# Patient Record
Sex: Female | Born: 1935 | Race: White | Hispanic: No | Smoking: Never smoker
Health system: Southern US, Community
[De-identification: ages and names within clinical notes are randomized; demographics above are authoritative.]

## PROBLEM LIST (undated history)

## (undated) DIAGNOSIS — I10 Essential (primary) hypertension: Secondary | ICD-10-CM

## (undated) HISTORY — PX: SUBMAXILLARY CYST  EXCISION: SHX2458

## (undated) HISTORY — PX: INTESTINAL MALROTATION REPAIR: SHX411

## (undated) HISTORY — PX: THYROIDECTOMY: SHX17

## (undated) HISTORY — PX: ABDOMINAL HYSTERECTOMY: SHX81

## (undated) HISTORY — PX: CARDIAC SURGERY: SHX584

## (undated) HISTORY — PX: DG KNEE LEFT COMPLETE (ARMC HX): HXRAD1556

## (undated) HISTORY — PX: FL INJ RIGHT KNEE MR ARTHROGRAM (ARMC HX): HXRAD1302

## (undated) HISTORY — PX: CHOLECYSTECTOMY: SHX55

---

## 2015-12-16 ENCOUNTER — Inpatient Hospital Stay
Admission: EM | Admit: 2015-12-16 | Discharge: 2015-12-17 | DRG: 379 | Disposition: A | Discharge status: Home / Self Care | Payer: Medicare Other | Attending: Internal Medicine | Admitting: Internal Medicine

## 2015-12-16 ENCOUNTER — Encounter: Payer: Self-pay | Admitting: Emergency Medicine

## 2015-12-16 ENCOUNTER — Inpatient Hospital Stay: Payer: Medicare Other

## 2015-12-16 DIAGNOSIS — K922 Gastrointestinal hemorrhage, unspecified: Secondary | ICD-10-CM | POA: Diagnosis present

## 2015-12-16 DIAGNOSIS — K625 Hemorrhage of anus and rectum: Secondary | ICD-10-CM | POA: Diagnosis present

## 2015-12-16 DIAGNOSIS — E860 Dehydration: Secondary | ICD-10-CM | POA: Diagnosis present

## 2015-12-16 DIAGNOSIS — Z833 Family history of diabetes mellitus: Secondary | ICD-10-CM | POA: Diagnosis not present

## 2015-12-16 DIAGNOSIS — I959 Hypotension, unspecified: Secondary | ICD-10-CM | POA: Diagnosis present

## 2015-12-16 DIAGNOSIS — D696 Thrombocytopenia, unspecified: Secondary | ICD-10-CM | POA: Diagnosis present

## 2015-12-16 DIAGNOSIS — K5791 Diverticulosis of intestine, part unspecified, without perforation or abscess with bleeding: Principal | ICD-10-CM | POA: Diagnosis present

## 2015-12-16 DIAGNOSIS — I10 Essential (primary) hypertension: Secondary | ICD-10-CM | POA: Diagnosis present

## 2015-12-16 DIAGNOSIS — D72829 Elevated white blood cell count, unspecified: Secondary | ICD-10-CM | POA: Diagnosis present

## 2015-12-16 HISTORY — DX: Essential (primary) hypertension: I10

## 2015-12-16 LAB — COMPREHENSIVE METABOLIC PANEL
ALBUMIN: 4 g/dL (ref 3.5–5.0)
ALK PHOS: 68 U/L (ref 38–126)
ALT: 16 U/L (ref 14–54)
AST: 23 U/L (ref 15–41)
Anion gap: 9 (ref 5–15)
BILIRUBIN TOTAL: 1.4 mg/dL — AB (ref 0.3–1.2)
BUN: 38 mg/dL — AB (ref 6–20)
CALCIUM: 8.6 mg/dL — AB (ref 8.9–10.3)
CO2: 25 mmol/L (ref 22–32)
CREATININE: 1.11 mg/dL — AB (ref 0.44–1.00)
Chloride: 106 mmol/L (ref 101–111)
GFR calc Af Amer: 48 mL/min — ABNORMAL LOW (ref >60)
GFR calc non Af Amer: 41 mL/min — ABNORMAL LOW (ref >60)
GLUCOSE: 155 mg/dL — AB (ref 65–99)
Potassium: 4.4 mmol/L (ref 3.5–5.1)
SODIUM: 140 mmol/L (ref 135–145)
TOTAL PROTEIN: 6.6 g/dL (ref 6.5–8.1)

## 2015-12-16 LAB — URINALYSIS COMPLETE WITH MICROSCOPIC (ARMC ONLY)
BACTERIA UA: NONE SEEN
Bilirubin Urine: NEGATIVE
Glucose, UA: NEGATIVE mg/dL
Nitrite: NEGATIVE
PH: 5 (ref 5.0–8.0)
Protein, ur: NEGATIVE mg/dL
Specific Gravity, Urine: 1.01 (ref 1.005–1.030)

## 2015-12-16 LAB — TYPE AND SCREEN
ABO/RH(D): O POS
ANTIBODY SCREEN: NEGATIVE

## 2015-12-16 LAB — CBC WITH DIFFERENTIAL/PLATELET
Basophils Absolute: 0 10*3/uL (ref 0–0.1)
EOS ABS: 0.1 10*3/uL (ref 0–0.7)
Eosinophils Relative: 0 %
HEMATOCRIT: 46 % (ref 35.0–47.0)
HEMOGLOBIN: 15.2 g/dL (ref 12.0–16.0)
LYMPHS ABS: 0.8 10*3/uL — AB (ref 1.0–3.6)
Lymphocytes Relative: 6 %
MCH: 31.2 pg (ref 26.0–34.0)
MCHC: 33.1 g/dL (ref 32.0–36.0)
MCV: 94.4 fL (ref 80.0–100.0)
Monocytes Absolute: 0.7 10*3/uL (ref 0.2–0.9)
NEUTROS ABS: 10.9 10*3/uL — AB (ref 1.4–6.5)
Platelets: 128 10*3/uL — ABNORMAL LOW (ref 150–440)
RBC: 4.87 MIL/uL (ref 3.80–5.20)
RDW: 14.7 % — ABNORMAL HIGH (ref 11.5–14.5)
WBC: 12.4 10*3/uL — AB (ref 3.6–11.0)

## 2015-12-16 LAB — PROTIME-INR
INR: 1.09
Prothrombin Time: 14.3 seconds (ref 11.4–15.0)

## 2015-12-16 LAB — HEMOGLOBIN: Hemoglobin: 13 g/dL (ref 12.0–16.0)

## 2015-12-16 LAB — LIPASE, BLOOD: Lipase: 30 U/L (ref 11–51)

## 2015-12-16 LAB — APTT: aPTT: <24 seconds — ABNORMAL LOW (ref 24–36)

## 2015-12-16 MED ORDER — SODIUM CHLORIDE 0.9 % IV SOLN
INTRAVENOUS | Status: DC
  Administered 2015-12-16 – 2015-12-17 (×2): via INTRAVENOUS

## 2015-12-16 MED ORDER — ACETAMINOPHEN 650 MG RE SUPP: 650.0000 mg | Freq: Four times a day (QID) | RECTAL | Status: DC | PRN

## 2015-12-16 MED ORDER — ACETAMINOPHEN 325 MG PO TABS: 650.0000 mg | ORAL_TABLET | Freq: Four times a day (QID) | ORAL | Status: DC | PRN

## 2015-12-16 MED ORDER — ONDANSETRON HCL 4 MG/2ML IJ SOLN: 4.0000 mg | Freq: Four times a day (QID) | INTRAMUSCULAR | Status: DC | PRN

## 2015-12-16 MED ORDER — SODIUM CHLORIDE 0.9 % IV BOLUS (SEPSIS): 500.0000 mL | Freq: Once | INTRAVENOUS | Status: DC

## 2015-12-16 MED ORDER — FAMOTIDINE IN NACL 20-0.9 MG/50ML-% IV SOLN
20.0000 mg | Freq: Two times a day (BID) | INTRAVENOUS | Status: DC
  Administered 2015-12-16 – 2015-12-17 (×3): 20 mg via INTRAVENOUS
  Filled 2015-12-16 (×5): qty 50

## 2015-12-16 MED ORDER — ONDANSETRON HCL 4 MG PO TABS: 4.0000 mg | ORAL_TABLET | Freq: Four times a day (QID) | ORAL | Status: DC | PRN

## 2015-12-16 MED ORDER — TECHNETIUM TC 99M-LABELED RED BLOOD CELLS IV KIT
20.0000 | PACK | Freq: Once | INTRAVENOUS | Status: AC | PRN
  Administered 2015-12-16: 21.805 via INTRAVENOUS

## 2015-12-16 MED ORDER — SODIUM CHLORIDE 0.9 % IV BOLUS (SEPSIS)
1000.0000 mL | Freq: Once | INTRAVENOUS | Status: AC
  Administered 2015-12-16: 1000 mL via INTRAVENOUS

## 2015-12-16 MED ORDER — ALBUTEROL SULFATE (2.5 MG/3ML) 0.083% IN NEBU: 2.5000 mg | INHALATION_SOLUTION | RESPIRATORY_TRACT | Status: DC | PRN

## 2015-12-16 NOTE — H&P (Signed)
[facility] - [place] at [facility]   PATIENT NAME: [place] Cleveland[place]othy [place]    MR#:  [id number]  DATE OF BIRTH:  05-Jul-1920  DATE OF ADMISSION:  12/16/2015  PRIMARY CARE PHYSICIAN: Pcp Not In System   REQUESTING/REFERRING PHYSICIAN: Gayla DossEryka A Gayle, MD  CHIEF COMPLAINT:   Chief Complaint  Patient presents with  . Rectal Bleeding  Rectal bleeding this morning.  HISTORY OF PRESENT ILLNESS:  [place]  is a 82 y.o. female with a known history of Hypertension. The patient noticed rectal bleeding with clotted blood at 5:00 this morning. So the patient was sent to ED for further evaluation. When the ED physician, Dr. Inocencio HomesGayle examed the patient, she still has rectal bleeding. The patient denies any other symptoms. No abdominal pain, nausea, vomiting or diarrhea or constipation. She never had GI bleeding before. She said that she might have colonoscopy more than 10 years ago. Her blood pressure decreased to 86/55 while lying on the bed when I examined the patient.. She is being treated with IV fluid in the ED.  PAST MEDICAL HISTORY:   Past Medical History  Diagnosis Date  . Hypertension     PAST SURGICAL HISTORY:   Past Surgical History  Procedure Laterality Date  . Thyroidectomy    . Abdominal hysterectomy    . Cardiac surgery    . Intestinal malrotation repair    . Submaxillary cyst  excision    . Dg knee left complete ([facility] hx)    . Fl inj right knee mr arthrogram ([facility] hx)    . Cholecystectomy      SOCIAL HISTORY:   Social History  Substance Use Topics  . Smoking status: Never Smoker   . Smokeless tobacco: Never Used  . Alcohol Use: No    FAMILY HISTORY:   Family History  Problem Relation Age of Onset  . Diabetes Mother     DRUG ALLERGIES:  No Known Allergies  REVIEW OF SYSTEMS:  CONSTITUTIONAL: No fever, fatigue or weakness.  EYES: No blurred or double vision.  EARS, NOSE, AND THROAT: No tinnitus or ear pain.  RESPIRATORY: No cough,  shortness of breath, wheezing or hemoptysis.  CARDIOVASCULAR: No chest pain, orthopnea, edema.  GASTROINTESTINAL: No nausea, vomiting, diarrhea or abdominal pain. But has rectal bleeding. GENITOURINARY: No dysuria, hematuria.  ENDOCRINE: No polyuria, nocturia,  HEMATOLOGY: No anemia, easy bruising or bleeding SKIN: No rash or lesion. MUSCULOSKELETAL: No joint pain or arthritis.   NEUROLOGIC: No tingling, numbness, weakness.  PSYCHIATRY: No anxiety or depression.   MEDICATIONS AT HOME:   Prior to Admission medications   Medication Sig Start Date End Date Taking? Authorizing Provider  diltiazem (TIAZAC) 180 MG 24 hr capsule Take 180 mg by mouth daily.   Yes Historical Provider, MD      VITAL SIGNS:  Blood pressure 145/87, pulse 97, temperature 97.6 F (36.4 C), temperature source Oral, resp. rate 19, height 5\' 4"  (1.626 m), weight 125 lb (56.7 kg), SpO2 99 %.  PHYSICAL EXAMINATION:  GENERAL:  82 y.o.-year-old patient lying in the bed with no acute distress.  EYES: Pupils equal, round, reactive to light and accommodation. No scleral icterus. Extraocular muscles intact.  HEENT: Head atraumatic, normocephalic. Oropharynx and nasopharynx clear.  NECK:  Supple, no jugular venous distention. No thyroid enlargement, no tenderness.  LUNGS: Normal breath sounds bilaterally, no wheezing, rales,rhonchi or crepitation. No use of accessory muscles of respiration.  CARDIOVASCULAR: S1, S2 normal. No murmurs, rubs, or gallops.  ABDOMEN: Soft, nontender,  nondistended. Bowel sounds present. No organomegaly or mass.  EXTREMITIES: Trace leg edema, no cyanosis, or clubbing.  NEUROLOGIC: Cranial nerves II through XII are intact. Muscle strength 5/5 in all extremities. Sensation intact. Gait not checked.  PSYCHIATRIC: The patient is alert and oriented x 3.  SKIN: No obvious rash, lesion, or ulcer.   LABORATORY PANEL:   CBC  Recent Labs Lab 12/16/15 0934  WBC 12.4*  HGB 15.2  HCT 46.0  PLT 128*    ------------------------------------------------------------------------------------------------------------------  Chemistries   Recent Labs Lab 12/16/15 0934  NA 140  K 4.4  CL 106  CO2 25  GLUCOSE 155*  BUN 38*  CREATININE 1.11*  CALCIUM 8.6*  AST 23  ALT 16  ALKPHOS 68  BILITOT 1.4*   ------------------------------------------------------------------------------------------------------------------  Cardiac Enzymes No results for input(s): TROPONINI in the last 168 hours. ------------------------------------------------------------------------------------------------------------------  RADIOLOGY:  No results found.  EKG:   Orders placed or performed during the hospital encounter of 12/16/15  . ED EKG  . ED EKG    IMPRESSION AND PLAN:   Rectal bleeding. NPO, pepcid iv bid, NS iv, GI consult. I discussed with Dr. Markham Jordan.  Dehydration. NS iv, follow up BMP.  Hypotension. NS iv bolus.  Leukocytosis. Unclear itiology, follow-up CBC.  Thrombocytopenia. Follow-up CBC.  HTN. Hold cardizem due to hypotension.   All the records are reviewed and case discussed with ED provider. Management plans discussed with the patient, her son and they are in agreement.  CODE STATUS: full code.  TOTAL TIME TAKING CARE OF THIS PATIENT: 55 minutes.    Shaune Pollack M.D on 12/16/2015 at 10:51 AM  Between 7am to 6pm - Pager - [phone]  After 6pm go to [email or web address] - password [email or web address]  [facility]  Office  [phone]  CC: Primary care physician; Pcp Not In System

## 2015-12-16 NOTE — ED Notes (Signed)
Pt arrived by EMS after having 7 episodes of rectal bleeding this morning. Pt states she felt as if she was going to have diarrhea but that it was "dark blood". Pt has hx of HTN.

## 2015-12-16 NOTE — Progress Notes (Signed)
Per Dr. Mechele CollinElliott place order for pt to have water and apple juice tonight. Okay to start clear liquids in the morning.

## 2015-12-16 NOTE — Consult Note (Signed)
GI Inpatient Consult Note  Reason for Consult: Rectal Bleeding    Attending Requesting Consult: Dr. Imogene Burnhen   History of Present Illness: [place] is a 82 y.o. female seen for evaluation of rectal bleeding at the request of Dr. Imogene Burnhen. PMHx of HTN.   She presents for rectal bleeding, began around 5am this morning. She reports 5 episodes of bleeding at home, mixture of BRB with dark clots. Acute onset of bleeding. No abdominal pain. She denies a history of constipation, diarrhea, nausea, vomiting. She has never had GI bleeding in past. Known history of diverticulosis with resection 1995. Believes last colonoscopy was 2004 only remarkable for diverticulosis. She denies history of PUD. No NSAID use. Doesn't take baby aspirin. Reports only medication is for HTN.    Last Colonoscopy: per patient 2004-diverticulosis.  Last Endoscopy: none prior    Past Medical History:  Past Medical History  Diagnosis Date  . Hypertension     Problem List: Patient Active Problem List   Diagnosis Date Noted  . GIB (gastrointestinal bleeding) 12/16/2015    Past Surgical History: Past Surgical History  Procedure Laterality Date  . Thyroidectomy    . Abdominal hysterectomy    . Cardiac surgery    . Intestinal malrotation repair    . Submaxillary cyst  excision    . Dg knee left complete ([facility] hx)    . Fl inj right knee mr arthrogram ([facility] hx)    . Cholecystectomy    Per patient 11 inch colon removed during diverticulitis resection, malrotation surgery details unclear.   Allergies: No Known Allergies  Home Medications:  (Not in a hospital admission) Home medication reconciliation was completed with the patient.   Scheduled Inpatient Medications:    Continuous Inpatient Infusions:    PRN Inpatient Medications:    Family History: family history includes Diabetes in her mother. Denies fam hx of GI bleeding, colon cancer, colon polyps.   Social History:   reports that she has never  smoked. She has never used smokeless tobacco. She reports that she does not drink alcohol or use illicit drugs.   Review of Systems: Constitutional: Weight is stable.  Eyes: No changes in vision. ENT: No oral lesions, sore throat.  GI: see HPI.  Heme/Lymph: No easy bruising.  CV: No chest pain.  GU: No hematuria.  Integumentary: No rashes.  Neuro: No headaches.  Psych: No depression/anxiety.  Endocrine: No heat/cold intolerance.  Allergic/Immunologic: No urticaria.  Resp: No cough, SOB.  Musculoskeletal: No joint swelling.    Physical Examination: BP 150/90 mmHg  Pulse 76  Temp(Src) 97.6 F (36.4 C) (Oral)  Resp 18  Ht 5\' 4"  (1.626 m)  Wt 125 lb (56.7 kg)  BMI 21.45 kg/m2  SpO2 98% Gen: NAD, alert and oriented x 4 HEENT: PEERLA, EOMI, Neck: supple, no JVD or thyromegaly Chest: CTA bilaterally, no wheezes, crackles, or other adventitious sounds CV: RRR, no m/g/c/r Abd: soft, NT, ND, +BS in all four quadrants-hyperactive; no HSM, guarding, ridigity, or rebound tenderness Rectal-depend saturated w/ BRB-maroon colored stool, BRB w/ dark clots on DRE. Ext: no edema, well perfused with 2+ pulses, Skin: no rash or lesions noted Lymph: no LAD  Data: Lab Results  Component Value Date   WBC 12.4* 12/16/2015   HGB 15.2 12/16/2015   HCT 46.0 12/16/2015   MCV 94.4 12/16/2015   PLT 128* 12/16/2015    Recent Labs Lab 12/16/15 0934  HGB 15.2   Lab Results  Component Value Date   NA 140  12/16/2015   K 4.4 12/16/2015   CL 106 12/16/2015   CO2 25 12/16/2015   BUN 38* 12/16/2015   CREATININE 1.11* 12/16/2015   Lab Results  Component Value Date   ALT 16 12/16/2015   AST 23 12/16/2015   ALKPHOS 68 12/16/2015   BILITOT 1.4* 12/16/2015    Recent Labs Lab 12/16/15 0934  APTT <24*  INR 1.09   Assessment/Plan: Ms. Krinsky is a 82 y.o. female admitted for GI bleeding.   1. Rectal bleeding - acute onset x 6 hours, 5 episodes of rectal bleeding. Most likely  diverticular. Less likely UGI source based on appearance. She does have known history of diverticulosis and reports recurrent diverticulitis w/ resection 20 years ago. Differential also includes AVM. Will proceed w/ stat bleeding scan, nuclear tech aware, discussed via phone.   Recommendations:  1. Monitor H/H 2. Stat Bleeding Scan    -Case discussed w/ Dr. Mechele Collin.   Thank you for the consult. Please call with questions or concerns.  Bluford Kaufmann, PA-C [facility] GI

## 2015-12-16 NOTE — ED Provider Notes (Signed)
[facility] Emergency Department Provider Note   ____________________________________________  Time seen: Approximately 9:07 AM  I have reviewed the triage vital signs and the nursing notes.   HISTORY  Chief Complaint Rectal Bleeding    HPI Arne ClevelandDorothy Banke is a 82 y.o. female with history of hypertension, no other chronic medical problems who presents for evaluation of several episodes of bright red blood per rectum since this morning, gradual onset, constant, no modifying factors, severe. No history of GI bleed. No abdominal pain. No vomiting or hematemesis. No chest pain or difficulty breathing.   Past Medical History  Diagnosis Date  . Hypertension     Patient Active Problem List   Diagnosis Date Noted  . GIB (gastrointestinal bleeding) 12/16/2015    Past Surgical History  Procedure Laterality Date  . Thyroidectomy    . Abdominal hysterectomy    . Cardiac surgery    . Intestinal malrotation repair    . Submaxillary cyst  excision    . Dg knee left complete ([facility] hx)    . Fl inj right knee mr arthrogram ([facility] hx)    . Cholecystectomy      No current outpatient prescriptions on file.  Allergies Review of patient's allergies indicates no known allergies.  Family History  Problem Relation Age of Onset  . Diabetes Mother     Social History Social History  Substance Use Topics  . Smoking status: Never Smoker   . Smokeless tobacco: Never Used  . Alcohol Use: No    Review of Systems Constitutional: No fever/chills Eyes: No visual changes. ENT: No sore throat. Cardiovascular: Denies chest pain. Respiratory: Denies shortness of breath. Gastrointestinal: No abdominal pain.  No nausea, no vomiting.  No diarrhea.  No constipation. Genitourinary: Negative for dysuria. Musculoskeletal: Negative for back pain. Skin: Negative for rash. Neurological: Negative for headaches, focal weakness or numbness.  10-point ROS otherwise  negative.  ____________________________________________   PHYSICAL EXAM:  Filed Vitals:   12/16/15 1204 12/16/15 1205 12/16/15 1323 12/16/15 1348  BP: 150/75 150/75 165/75   Pulse: 71  86   Temp:   98.2 F (36.8 C)   TempSrc:   Oral   Resp: 21  14   Height:    5\' 4"  (1.626 m)  Weight:    113 lb 3.2 oz (51.347 kg)  SpO2: 99%  99%        Constitutional: Alert and oriented. Well appearing and in no acute distress. Eyes: Conjunctivae are normal. PERRL. EOMI. Head: Atraumatic. Nose: No congestion/rhinnorhea. Mouth/Throat: Mucous membranes are moist.  Oropharynx non-erythematous. Neck: No stridor. Supple without meningismus. Cardiovascular: Normal rate, regular rhythm. Grossly normal heart sounds.  Good peripheral circulation. Respiratory: Normal respiratory effort.  No retractions. Lungs CTAB. Gastrointestinal: Soft and nontender. No distention. No CVA tenderness. Rectal: bright blood in the rectal vault is strongly guaiac positive. Musculoskeletal: No lower extremity tenderness nor edema.  No joint effusions. Neurologic:  Normal speech and language. No gross focal neurologic deficits are appreciated. No gait instability. Skin:  Skin is warm, dry and intact. No rash noted. Psychiatric: Mood and affect are normal. Speech and behavior are normal.  ____________________________________________   LABS (all labs ordered are listed, but only abnormal results are displayed)  Labs Reviewed  CBC WITH DIFFERENTIAL/PLATELET - Abnormal; Notable for the following:    WBC 12.4 (*)    RDW 14.7 (*)    Platelets 128 (*)    Neutro Abs 10.9 (*)    Lymphs Abs 0.8 (*)  All other components within normal limits  COMPREHENSIVE METABOLIC PANEL - Abnormal; Notable for the following:    Glucose, Bld 155 (*)    BUN 38 (*)    Creatinine, Ser 1.11 (*)    Calcium 8.6 (*)    Total Bilirubin 1.4 (*)    GFR calc non Af Amer 41 (*)    GFR calc Af Amer 48 (*)    All other components within normal  limits  APTT - Abnormal; Notable for the following:    aPTT <24 (*)    All other components within normal limits  LIPASE, BLOOD  PROTIME-INR  HEMOGLOBIN  HEMOGLOBIN  URINALYSIS COMPLETEWITH MICROSCOPIC ([facility] ONLY)  TYPE AND SCREEN   ____________________________________________  EKG  ED ECG REPORT I, Gayla Doss, the attending physician, personally viewed and interpreted this ECG.   Date: 12/16/2015  EKG Time: 10:18  Rate: 76  Rhythm: sinus rhythm with PACs  Axis: normal  Intervals:none  ST&T Change: No acute ST elevation.  ____________________________________________  RADIOLOGY  none ____________________________________________   PROCEDURES  Procedure(s) performed: None  Critical Care performed: No  ____________________________________________   INITIAL IMPRESSION / ASSESSMENT AND PLAN / ED COURSE  Pertinent labs & imaging results that were available during my care of the patient were reviewed by me and considered in my medical decision making (see chart for details).  Saree Krogh is a 82 y.o. female with history of hypertension, no other chronic medical problems who presents for evaluation of several episodes of bright red blood per rectum. On exam, she is nontoxic appearing in no acute distress. Her vital signs are stable, she is afebrile. She does have skin pallor but she is not tachycardic, tachypneic or hypotensive. She has bright red blood in the rectal vault concerning for ongoing lower GI bleed. She is not chronically anticoagulated. I reviewed her labs. CBC with mild leukocytosis, white blood cell count is 12.4, hemoglobin 15.2. Platelets are slightly low at 128. CMP with mild BUN and creatinine elevation, will give IV fluids. Case discussed with hospitalist, Dr. Imogene Burn for admission at 10:30 AM. ____________________________________________   FINAL CLINICAL IMPRESSION(S) / ED DIAGNOSES  Final diagnoses:  Lower GI bleed      NEW MEDICATIONS  STARTED DURING THIS VISIT:  Current Discharge Medication List       Note:  This document was prepared using Dragon voice recognition software and may include unintentional dictation errors.    Gayla Doss, MD 12/16/15 [phone]

## 2015-12-17 LAB — CBC
HCT: 36.3 % (ref 35.0–47.0)
Hemoglobin: 12.2 g/dL (ref 12.0–16.0)
MCH: 31.9 pg (ref 26.0–34.0)
MCHC: 33.7 g/dL (ref 32.0–36.0)
MCV: 94.7 fL (ref 80.0–100.0)
PLATELETS: 117 10*3/uL — AB (ref 150–440)
RBC: 3.83 MIL/uL (ref 3.80–5.20)
RDW: 14.5 % (ref 11.5–14.5)
WBC: 6.4 10*3/uL (ref 3.6–11.0)

## 2015-12-17 LAB — BASIC METABOLIC PANEL
Anion gap: 6 (ref 5–15)
BUN: 25 mg/dL — ABNORMAL HIGH (ref 6–20)
CALCIUM: 7.8 mg/dL — AB (ref 8.9–10.3)
CO2: 22 mmol/L (ref 22–32)
CREATININE: 0.96 mg/dL (ref 0.44–1.00)
Chloride: 115 mmol/L — ABNORMAL HIGH (ref 101–111)
GFR, EST AFRICAN AMERICAN: 57 mL/min — AB (ref >60)
GFR, EST NON AFRICAN AMERICAN: 49 mL/min — AB (ref >60)
Glucose, Bld: 84 mg/dL (ref 65–99)
Potassium: 4 mmol/L (ref 3.5–5.1)
SODIUM: 143 mmol/L (ref 135–145)

## 2015-12-17 LAB — HEMOGLOBIN
HEMOGLOBIN: 12.3 g/dL (ref 12.0–16.0)
HEMOGLOBIN: 12.2 g/dL (ref 12.0–16.0)

## 2015-12-17 NOTE — Progress Notes (Signed)
12/17/2015 15:00  Arne Clevelandorothy Humiston to be D/C'd Home per MD order.  Discussed prescriptions and follow up appointments with the patient. Prescriptions given to patient, medication list explained in detail. Pt verbalized understanding.    Medication List    TAKE these medications        diltiazem 180 MG 24 hr capsule  Commonly known as:  TIAZAC  Take 180 mg by mouth daily.        Filed Vitals:   12/17/15 0604 12/17/15 1222  BP: 121/67 120/63  Pulse: 84 86  Temp: 98 F (36.7 C) 98.4 F (36.9 C)  Resp: 16 16    Skin clean, dry and intact without evidence of skin break down, no evidence of skin tears noted. IV catheter discontinued intact. Site without signs and symptoms of complications. Dressing and pressure applied. Pt denies pain at this time. No complaints noted.  An After Visit Summary was printed and given to the patient. Patient escorted via WC, and D/C home via private auto.  Bradly Chrisougherty, Omid Deardorff E

## 2015-12-17 NOTE — Care Management (Signed)
Patient admitted with rectal bleed.  Visiting from her home in [place].  Patient lives at home alone.  Her son who lives locally to her is at bedside.  Patient states that she uses no medical equipment at baseline, drives, and is independent of all ADL's.  Patient obtains her medications through mail scripts.  No RNCM needs identified.  Patient to discharge home today.  Son to transport patient at time of discharge.

## 2015-12-17 NOTE — Discharge Instructions (Signed)

## 2015-12-24 NOTE — Discharge Summary (Signed)
[facility] - [place] at [facility]   PATIENT NAME: [place]    MR#:  [id number]  DATE OF BIRTH:  1920/01/18  DATE OF ADMISSION:  12/16/2015 ADMITTING PHYSICIAN: Shaune Pollack, MD  DATE OF DISCHARGE: 12/17/2015  3:06 PM  PRIMARY CARE PHYSICIAN: Pcp Not In System   ADMISSION DIAGNOSIS:  Rectal bleeding [K62.5] Lower GI bleed [K92.2]  DISCHARGE DIAGNOSIS:  Principal Problem:   GIB (gastrointestinal bleeding)   SECONDARY DIAGNOSIS:   Past Medical History  Diagnosis Date  . Hypertension      ADMITTING HISTORY  [place] is a 82 y.o. female with a known history of Hypertension. The patient noticed rectal bleeding with clotted blood at 5:00 this morning. So the patient was sent to ED for further evaluation. When the ED physician, Dr. Inocencio Homes examed the patient, she still has rectal bleeding. The patient denies any other symptoms. No abdominal pain, nausea, vomiting or diarrhea or constipation. She never had GI bleeding before. She said that she might have colonoscopy more than 10 years ago. Her blood pressure decreased to 86/55 while lying on the bed when I examined the patient.. She is being treated with IV fluid in the ED.  HOSPITAL COURSE:   * Admitted to medical floor due to significant GI bleed. Pt had a Nuclear bleeding scan which showed no bleeding. Vitals stayed stable and Hb was stable. Did not need any transfusion. Was seen by GI and thought to have diverticular bleed which pt had in the past. Discussed with Dr. Mechele Collin who advised patient can be discharged home with OP f/u. NO further bleeding. No abd pain. Tolerated diet.  Stable for discharge home with OP GI f/u.  CONSULTS OBTAINED:  Treatment Team:  Scot Jun, MD  DRUG ALLERGIES:  No Known Allergies  DISCHARGE MEDICATIONS:   Discharge Medication List as of 12/17/2015  2:50 PM    CONTINUE these medications which have NOT CHANGED   Details  diltiazem (TIAZAC) 180 MG 24 hr  capsule Take 180 mg by mouth daily., Until Discontinued, Historical Med        Today   VITAL SIGNS:  Blood pressure 120/63, pulse 86, temperature 98.4 F (36.9 C), temperature source Oral, resp. rate 16, height  (1.626 m), weight 51.347 kg (113 lb 3.2 oz), SpO2 98 %.  I/O:  No intake or output data in the 24 hours ending 12/24/15 1006  PHYSICAL EXAMINATION:  Physical Exam  GENERAL:  82 y.o.-year-old patient lying in the bed with no acute distress.  LUNGS: Normal breath sounds bilaterally, no wheezing, rales,rhonchi or crepitation. No use of accessory muscles of respiration.  CARDIOVASCULAR: S1, S2 normal. No murmurs, rubs, or gallops.  ABDOMEN: Soft, non-tender, non-distended. Bowel sounds present. No organomegaly or mass.  NEUROLOGIC: Moves all 4 extremities. PSYCHIATRIC: The patient is alert and oriented x 3.  SKIN: No obvious rash, lesion, or ulcer.   DATA REVIEW:   CBC No results for input(s): WBC, HGB, HCT, PLT in the last 168 hours.  Chemistries  No results for input(s): NA, K, CL, CO2, GLUCOSE, BUN, CREATININE, CALCIUM, MG, AST, ALT, ALKPHOS, BILITOT in the last 168 hours.  Invalid input(s): GFRCGP  Cardiac Enzymes No results for input(s): TROPONINI in the last 168 hours.  Microbiology Results  No results found for this or any previous visit.  RADIOLOGY:  No results found.  Follow up with PCP in 1 week.  Management plans discussed with the patient, family and they are in agreement.  CODE STATUS:  Code Status History    Date Active Date Inactive Code Status Order ID Comments User Context   12/16/2015  1:29 PM 12/17/2015  6:06 PM Full Code [id number]  Shaune PollackQing Chen, MD Inpatient    Advance Directive Documentation        Most Recent Value   Type of Advance Directive  Living will   Pre-existing out of facility DNR order (yellow form or pink MOST form)     "MOST" Form in Place?        TOTAL TIME TAKING CARE OF THIS PATIENT ON DAY OF DISCHARGE: more than 30  minutes.   Milagros LollSudini, Rickita Forstner R M.D on 12/24/2015 at 10:06 AM  Between 7am to 6pm - Pager - [phone]  After 6pm go to [email or web address] - password [email or web address]  [facility]  Office  [phone]  CC: Primary care physician; Pcp Not In System  Note: This dictation was prepared with Dragon dictation along with smaller phrase technology. Any transcriptional errors that result from this process are unintentional.

## 2017-09-19 IMAGING — NM NM GI BLOOD LOSS
2 series · 12 of 12 positions shown · non-contrast
Comparison: None.

CLINICAL DATA: [AGE] female with rectal bleeding.

EXAM:
NUCLEAR MEDICINE GASTROINTESTINAL BLEEDING SCAN
TECHNIQUE: Sequential abdominal images were obtained following intravenous
administration of 9c-TTm labeled red blood cells.
RADIOPHARMACEUTICALS:  21.8 mCi 9c-TTm in-vitro labeled red cells.

[Series 1000: gi bleed · 4.80mm/px · 6 of 60 frames shown (1 of 2)]
[frame 6/60]
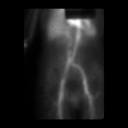
[frame 16/60]
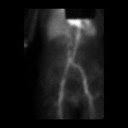
[frame 26/60]
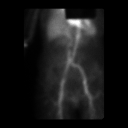
[frame 36/60]
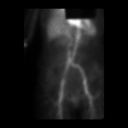
[frame 46/60]
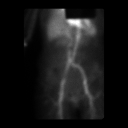
[frame 56/60]
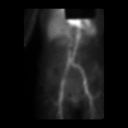

[Series 1000: gi bleed · 4.80mm/px · 6 of 60 frames shown (2 of 2)]
[frame 6/60]
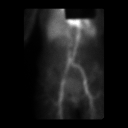
[frame 16/60]
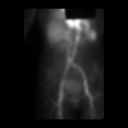
[frame 26/60]
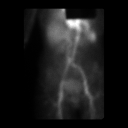
[frame 36/60]
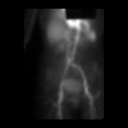
[frame 46/60]
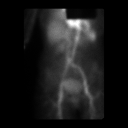
[frame 56/60]
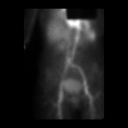

[12 of 12 positions shown; findings below may reference images not displayed]

FINDINGS: No abnormal areas of increased activity are noted to suggest active
GI bleeding.

No other significant abnormalities identified.
IMPRESSION: No evidence of active GI bleeding.
